# Patient Record
Sex: Male | Born: 1990 | Race: White | Hispanic: No | Marital: Single | Smoking: Never smoker
Health system: Southern US, Community
[De-identification: ages and names within clinical notes are randomized; demographics above are authoritative.]

---

## 2016-08-23 ENCOUNTER — Encounter (HOSPITAL_COMMUNITY): Payer: Self-pay

## 2016-08-23 DIAGNOSIS — Y939 Activity, unspecified: Secondary | ICD-10-CM | POA: Diagnosis not present

## 2016-08-23 DIAGNOSIS — T18120A Food in esophagus causing compression of trachea, initial encounter: Secondary | ICD-10-CM | POA: Insufficient documentation

## 2016-08-23 DIAGNOSIS — Y999 Unspecified external cause status: Secondary | ICD-10-CM | POA: Insufficient documentation

## 2016-08-23 DIAGNOSIS — Y929 Unspecified place or not applicable: Secondary | ICD-10-CM | POA: Insufficient documentation

## 2016-08-23 DIAGNOSIS — X58XXXA Exposure to other specified factors, initial encounter: Secondary | ICD-10-CM | POA: Insufficient documentation

## 2016-08-23 DIAGNOSIS — R1310 Dysphagia, unspecified: Secondary | ICD-10-CM | POA: Diagnosis present

## 2016-08-23 NOTE — ED Triage Notes (Signed)
Pt states that after eating dinner tonight he started having trouble swallowing, pt states that he has had this problem before, pt does not feel that anything is lodged in his throat, pt states that he cant stop vomiting or spitting since then and feels dehydrated.

## 2016-08-24 ENCOUNTER — Emergency Department (HOSPITAL_COMMUNITY)
Admission: EM | Admit: 2016-08-24 | Discharge: 2016-08-24 | Disposition: A | Discharge status: Home / Self Care | Payer: 59 | Attending: Emergency Medicine | Admitting: Emergency Medicine

## 2016-08-24 ENCOUNTER — Emergency Department (HOSPITAL_COMMUNITY): Payer: 59

## 2016-08-24 DIAGNOSIS — T18128A Food in esophagus causing other injury, initial encounter: Secondary | ICD-10-CM

## 2016-08-24 MED ORDER — DIAZEPAM 5 MG/ML IJ SOLN
5.0000 mg | Freq: Once | INTRAMUSCULAR | Status: AC
  Administered 2016-08-24: 5 mg via INTRAVENOUS
  Filled 2016-08-24: qty 2

## 2016-08-24 MED ORDER — GLUCAGON HCL RDNA (DIAGNOSTIC) 1 MG IJ SOLR
1.0000 mg | Freq: Once | INTRAMUSCULAR | Status: AC
  Administered 2016-08-24: 1 mg via INTRAVENOUS
  Filled 2016-08-24: qty 1

## 2016-08-24 MED ORDER — SODIUM CHLORIDE 0.9 % IV BOLUS (SEPSIS)
1000.0000 mL | Freq: Once | INTRAVENOUS | Status: AC
  Administered 2016-08-24: 1000 mL via INTRAVENOUS

## 2016-08-24 NOTE — ED Notes (Signed)
Patient left at this time with all belongings. 

## 2016-08-24 NOTE — ED Notes (Signed)
No change, still not able to swallow saliva successfully.

## 2016-08-24 NOTE — ED Notes (Signed)
Notified TMohr PA-C of continued obstruction.

## 2016-08-24 NOTE — ED Provider Notes (Signed)
[facility]-EMERGENCY DEPT Provider Note   CSN: [id number] Arrival date & time: 08/23/16  2302     History   Chief Complaint Chief Complaint  Patient presents with  . Swallowing problem    HPI Curtis Robbins is a 26 y.o. male.  HPI 26 y.o. male presents to the Emergency Department today complaining of eating dinner tonight and having trouble swallowing. Notes that he has had a similar problem in the past. Pt states that he does not feel like he has something lodged in his throat, but states that he cannot stop vomiting or spitting since then. States that he is able to swallow and feel it go down his throat, but when he feels it around his stomach he immediately regurgitates. No diarrhea. No CP/SOB/ABD pain. No fevers. Denies pain currently. Denies allergies. No other symptoms noted.   History reviewed. No pertinent past medical history.  There are no active problems to display for this patient.   History reviewed. No pertinent surgical history.     Home Medications    Prior to Admission medications   Not on File    Family History No family history on file.  Social History Social History  Substance Use Topics  . Smoking status: Never Smoker  . Smokeless tobacco: Never Used  . Alcohol use No     Allergies   Amoxicillin   Review of Systems Review of Systems ROS reviewed and all are negative for acute change except as noted in the HPI.  Physical Exam Updated Vital Signs BP (!) 159/95 (BP Location: Left Arm)   Pulse 84   Temp 98.2 F (36.8 C) (Oral)   Resp 18   Ht 5\' 11"  (1.803 m)   Wt 79.4 kg   SpO2 99%   BMI 24.41 kg/m   Physical Exam  Constitutional: He is oriented to person, place, and time. Vital signs are normal. He appears well-developed and well-nourished.  NAD. Sitting upright.   HENT:  Head: Normocephalic and atraumatic.  Right Ear: Hearing normal.  Left Ear: Hearing normal.  Posterior oropharynx unremarkable. No edema. Phonating well. Full  ROM of neck without discomfort.   Eyes: Conjunctivae and EOM are normal. Pupils are equal, round, and reactive to light.  Neck: Normal range of motion. Neck supple.  Cardiovascular: Normal rate, regular rhythm, normal heart sounds and intact distal pulses.   Pulmonary/Chest: Effort normal and breath sounds normal.  Abdominal: Soft. Normal appearance and bowel sounds are normal. There is no tenderness. There is no rigidity, no rebound, no guarding, no CVA tenderness, no tenderness at McBurney's point and negative Murphy's sign.  Musculoskeletal: Normal range of motion.  Neurological: He is alert and oriented to person, place, and time.  Skin: Skin is warm and dry.  Psychiatric: He has a normal mood and affect. His speech is normal and behavior is normal. Thought content normal.  Nursing note and vitals reviewed.  ED Treatments / Results  Labs (all labs ordered are listed, but only abnormal results are displayed) Labs Reviewed - No data to display  EKG  EKG Interpretation None      Radiology Dg Chest 2 View  Result Date: 08/24/2016 CLINICAL DATA:  Food impaction. EXAM: CHEST  2 VIEW COMPARISON:  None. FINDINGS: The lungs are clear. The pulmonary vasculature is normal. Heart size is normal. Hilar and mediastinal contours are unremarkable. There is no pleural effusion. IMPRESSION: No active cardiopulmonary disease. Electronically Signed   By: Ellery Plunkaniel R Mitchell M.D.   On: 08/24/2016 00:30  Procedures Procedures (including critical care time)  Medications Ordered in ED Medications - No data to display   Initial Impression / Assessment and Plan / ED Course  I have reviewed the triage vital signs and the nursing notes.  Pertinent labs & imaging results that were available during my care of the patient were reviewed by me and considered in my medical decision making (see chart for details).  Final Clinical Impressions(s) / ED Diagnoses   {I have reviewed and evaluated the relevant  imaging studies.  {I have reviewed the relevant previous healthcare records.  {I obtained HPI from historian.   ED Course:  Assessment: Pt is a 26 y.o. male who presents with trouble swallowing. Notes hx same in past. States that he was eating dinner and then regurgitated food. Notes ability to swallow past throat, but once it hits stomach, he regurgitates. PT states he was given a medication in the past that made him throw up and it resolved. Denies allergies. No trouble breathing. No CP. No fevers. On exam, pt in NAD. Nontoxic/nonseptic appearing. VSS. Afebrile. Lungs CTA. Heart RRR. Abdomen nontender soft. CXR unremarkable. Given NS bolus and glucagon in ED as well as valium in ED without resolution. PO challenge failed.   6:08 AM- Pt able to tolerate secretions spontaneously. Fluid challenge without difficulty.   Plan is to DC home with follow up to PCP. At time of discharge, Patient is in no acute distress. Vital Signs are stable. Patient is able to ambulate. Patient able to tolerate PO.   Disposition/Plan:  DC Home Additional Verbal discharge instructions given and discussed with patient.  Pt Instructed to f/u with PCP in the next week for evaluation and treatment of symptoms. Return precautions given Pt acknowledges and agrees with plan  Supervising Physician Geoffery Lyons, MD  Final diagnoses:  Food impaction of esophagus, initial encounter    New Prescriptions New Prescriptions   No medications on file     Audry Pili, PA-C 08/24/16 1610    Geoffery Lyons, MD 08/24/16 [phone]

## 2016-08-24 NOTE — ED Notes (Signed)
Informed pt that d/t valium given IV, he cannot drive or work today. Pt notifying his employer via text at this time.

## 2016-08-24 NOTE — ED Notes (Signed)
Pt reports maybe swallowing bolus. Tolerated 12oz fluids. Now c/o heartburn. PA notified.

## 2016-08-24 NOTE — Discharge Instructions (Addendum)
Please read and follow all provided instructions.  Your diagnoses today include:  1. Food impaction of esophagus, initial encounter     Tests performed today include: Vital signs. See below for your results today.   Medications prescribed:  Take as prescribed   Home care instructions:  Follow any educational materials contained in this packet.  Follow-up instructions: Please follow-up with your primary care provider for further evaluation of symptoms and treatment   Return instructions:  Please return to the Emergency Department if you do not get better, if you get worse, or new symptoms OR  - Fever (temperature greater than 101.55F)  - Bleeding that does not stop with holding pressure to the area    -Severe pain (please note that you may be more sore the day after your accident)  - Chest Pain  - Difficulty breathing  - Severe nausea or vomiting  - Inability to tolerate food and liquids  - Passing out  - Skin becoming red around your wounds  - Change in mental status (confusion or lethargy)  - New numbness or weakness    Please return if you have any other emergent concerns.  Additional Information:  Your vital signs today were: BP (!) 144/81    Pulse 86    Temp 98.2 F (36.8 C) (Oral)    Resp 17    Ht 5\' 11"  (1.803 m)    Wt 79.4 kg    SpO2 100%    BMI 24.41 kg/m  If your blood pressure (BP) was elevated above 135/85 this visit, please have this repeated by your doctor within one month. ---------------

## 2016-08-24 NOTE — ED Notes (Signed)
Pt reports food bolus stuck since 1700 yesterday. No bolus visualized in throat, moving air well. No stridor. No drooling, however unable to swallow. Emesis bag full of saliva.

## 2018-07-05 IMAGING — DX DG CHEST 2V
2 series · 2 of 2 positions shown · non-contrast
Comparison: None.

CLINICAL DATA: Food impaction.

EXAM:
CHEST  2 VIEW

[w chest pa]
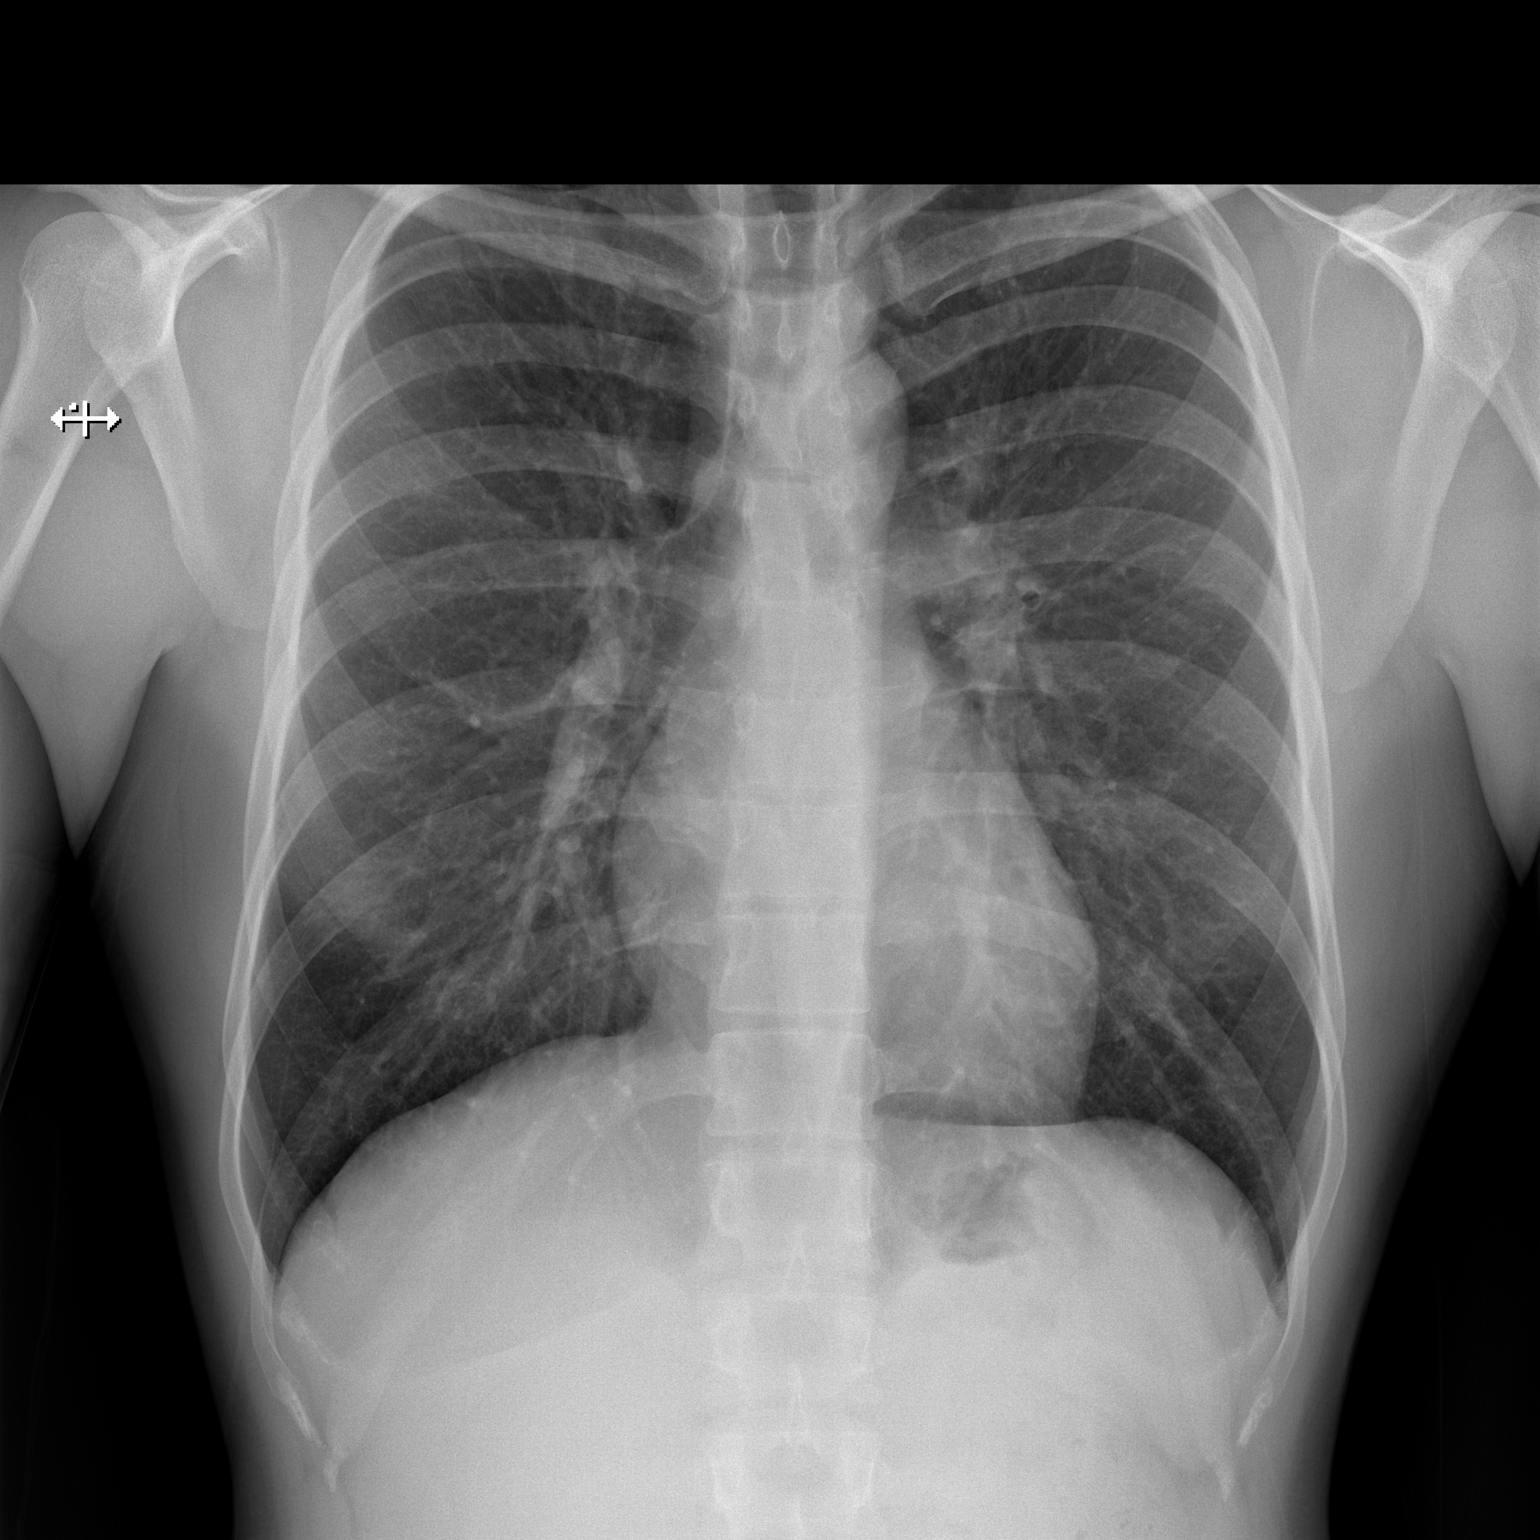

[w chest lat]
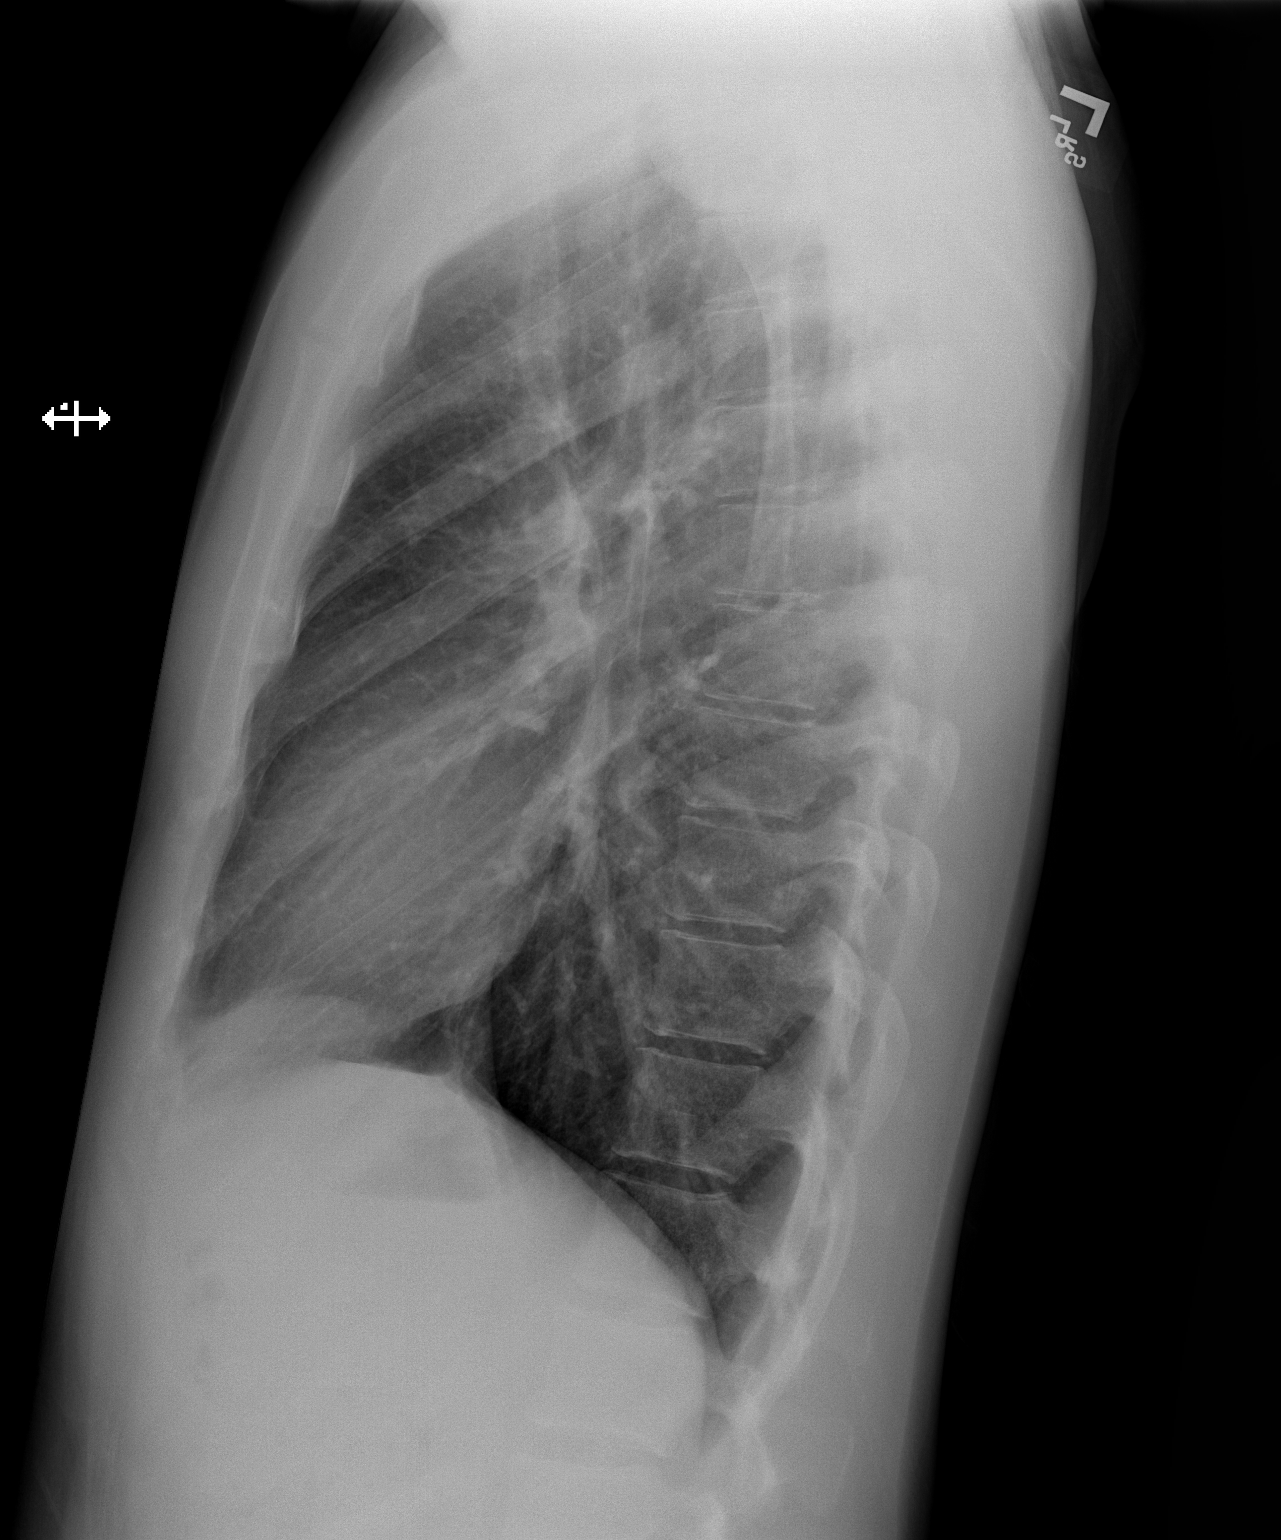

[2 of 2 positions shown; findings below may reference images not displayed]

FINDINGS: The lungs are clear. The pulmonary vasculature is normal. Heart size
is normal. Hilar and mediastinal contours are unremarkable. There is
no pleural effusion.
IMPRESSION: No active cardiopulmonary disease.
